# Patient Record
Sex: Male | Born: 2000 | Race: Black or African American | Hispanic: No | Marital: Single
Health system: Southern US, Community
[De-identification: ages and names within clinical notes are randomized; demographics above are authoritative.]

## PROBLEM LIST (undated history)

## (undated) DIAGNOSIS — F909 Attention-deficit hyperactivity disorder, unspecified type: Secondary | ICD-10-CM

## (undated) DIAGNOSIS — F259 Schizoaffective disorder, unspecified: Secondary | ICD-10-CM

## (undated) DIAGNOSIS — S62309A Unspecified fracture of unspecified metacarpal bone, initial encounter for closed fracture: Secondary | ICD-10-CM

## (undated) DIAGNOSIS — G43909 Migraine, unspecified, not intractable, without status migrainosus: Secondary | ICD-10-CM

## (undated) DIAGNOSIS — G40A09 Absence epileptic syndrome, not intractable, without status epilepticus: Secondary | ICD-10-CM

---
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
## 2019-07-05 IMAGING — RF RIGHT HAND - COMPLETE 3+ VIEW
1 series · 4 of 4 positions shown · non-contrast
Comparison: 02/27/2016

CLINICAL DATA: Fractures

EXAM:
RIGHT HAND - COMPLETE 3+ VIEW

[Series 1: run · 4 of 4 slices shown]
[im 1/4]
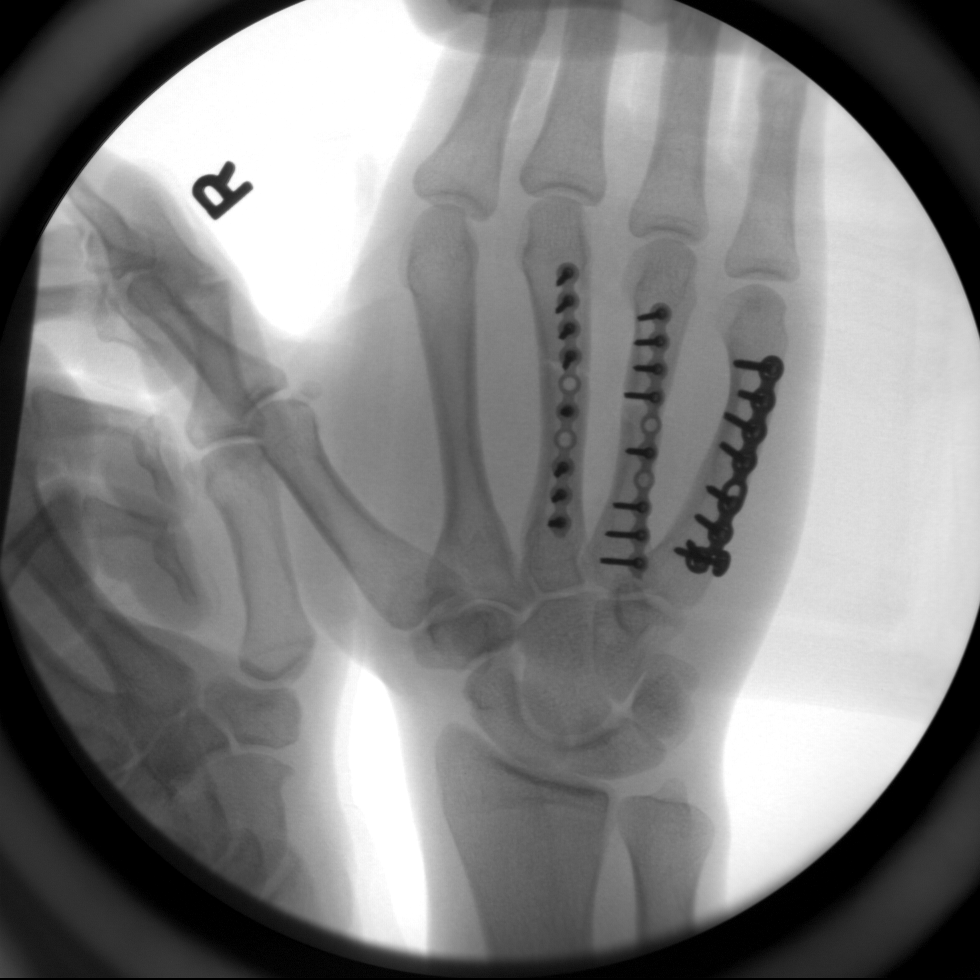
[im 2/4]
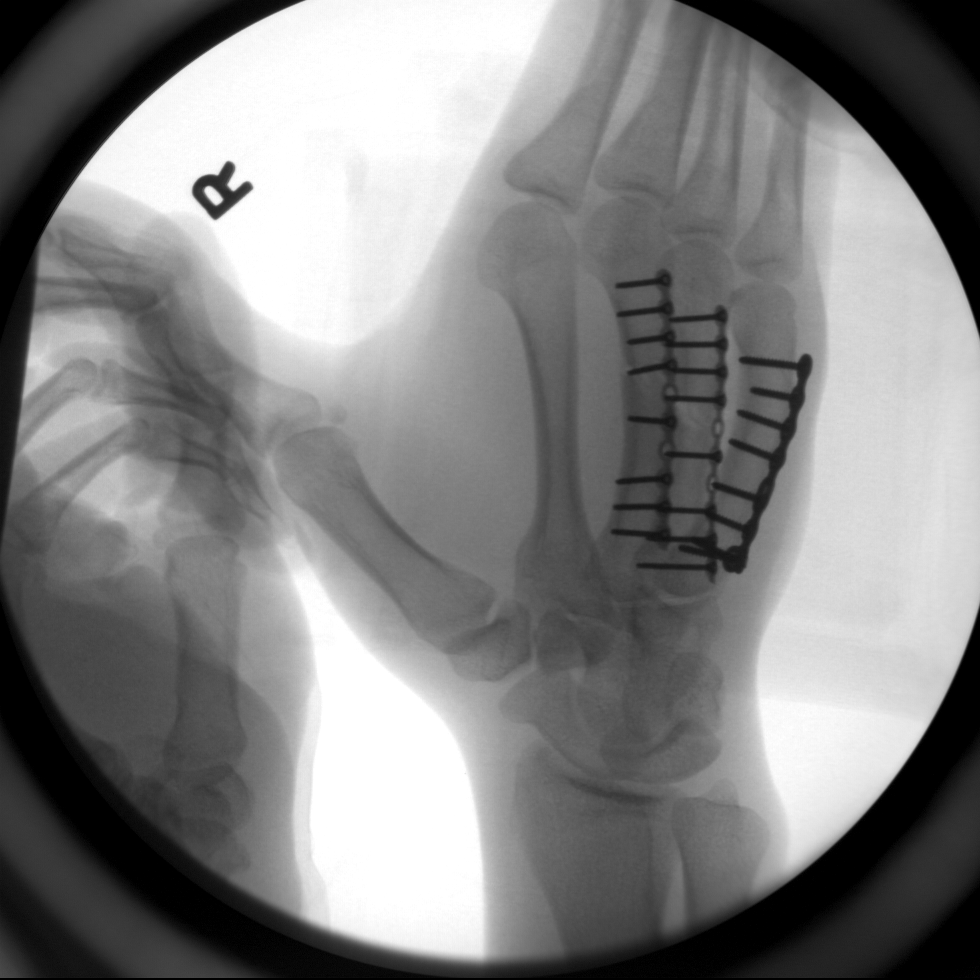
[im 3/4]
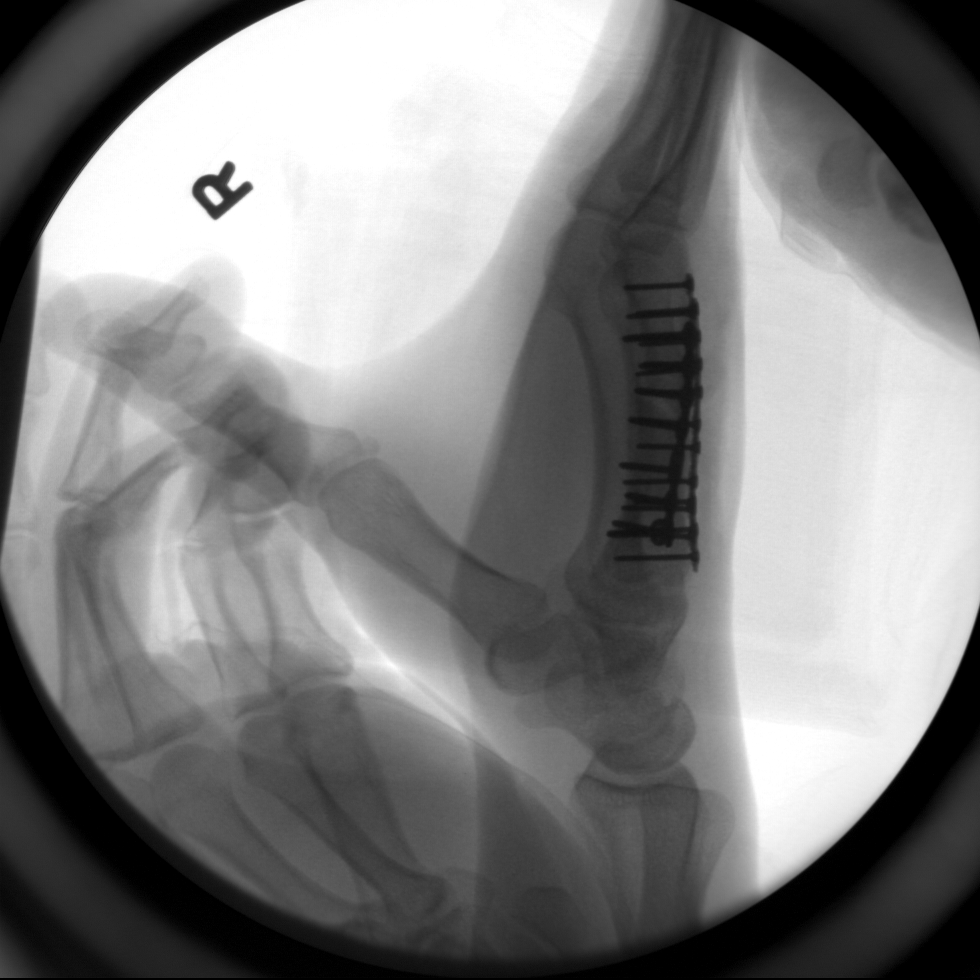
[im 4/4]
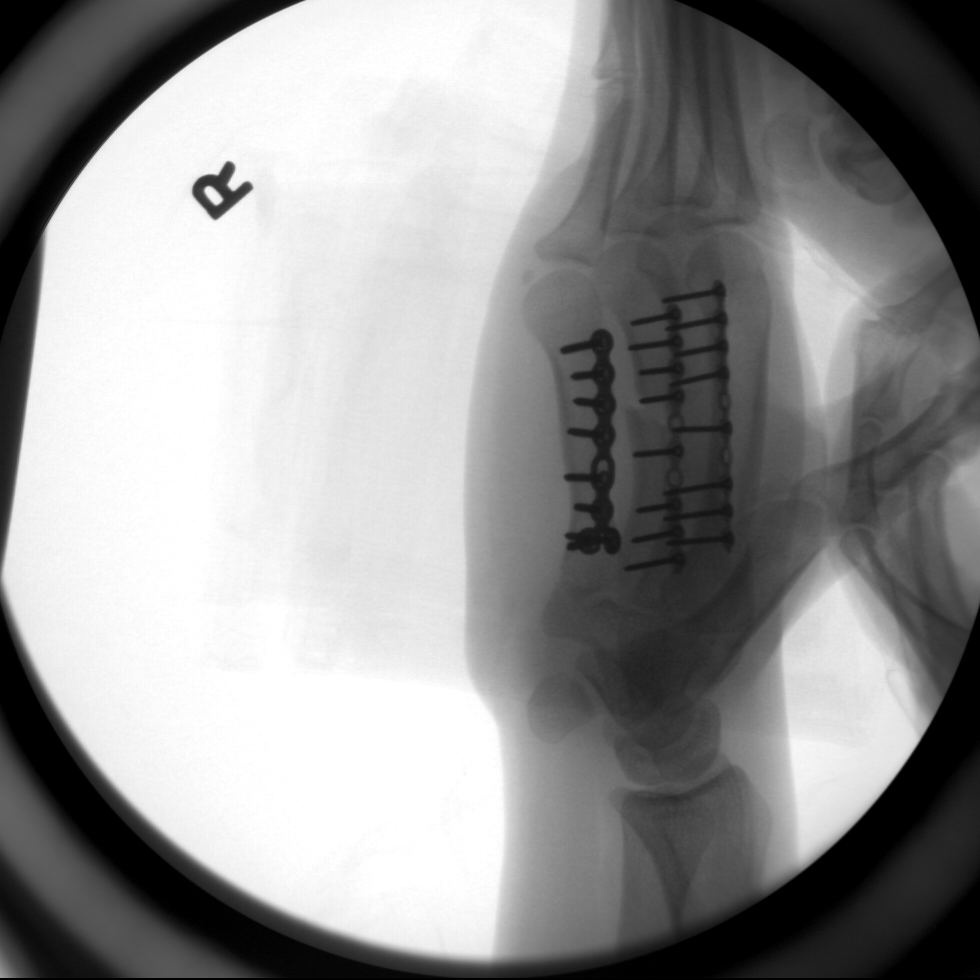

[4 of 4 positions shown; findings below may reference images not displayed]

FINDINGS: The patient has undergone ORIF of the third and fourth metacarpals
with plates and screws. The patient is status post remote ORIF of
the fifth metacarpal. The alignment appears improved. There is no
unexpected radiopaque foreign body.
IMPRESSION: Status post ORIF of the third and fourth metacarpals.
